# Patient Record
Sex: Male | Born: 1990 | Race: Black or African American | Hispanic: No | Marital: Single | Smoking: Never smoker
Health system: Southern US, Community
[De-identification: ages and names within clinical notes are randomized; demographics above are authoritative.]

## PROBLEM LIST (undated history)

## (undated) DIAGNOSIS — I10 Essential (primary) hypertension: Secondary | ICD-10-CM

---

## 2016-11-10 ENCOUNTER — Encounter (HOSPITAL_COMMUNITY): Payer: Self-pay | Admitting: Emergency Medicine

## 2016-11-10 ENCOUNTER — Emergency Department (HOSPITAL_COMMUNITY): Payer: 59

## 2016-11-10 ENCOUNTER — Emergency Department (HOSPITAL_COMMUNITY)
Admission: EM | Admit: 2016-11-10 | Discharge: 2016-11-10 | Disposition: A | Discharge status: Home / Self Care | Payer: 59 | Attending: Emergency Medicine | Admitting: Emergency Medicine

## 2016-11-10 DIAGNOSIS — Y9289 Other specified places as the place of occurrence of the external cause: Secondary | ICD-10-CM | POA: Diagnosis not present

## 2016-11-10 DIAGNOSIS — S62609A Fracture of unspecified phalanx of unspecified finger, initial encounter for closed fracture: Secondary | ICD-10-CM

## 2016-11-10 DIAGNOSIS — S6991XA Unspecified injury of right wrist, hand and finger(s), initial encounter: Secondary | ICD-10-CM | POA: Diagnosis present

## 2016-11-10 DIAGNOSIS — Y9367 Activity, basketball: Secondary | ICD-10-CM | POA: Insufficient documentation

## 2016-11-10 DIAGNOSIS — W230XXA Caught, crushed, jammed, or pinched between moving objects, initial encounter: Secondary | ICD-10-CM | POA: Diagnosis not present

## 2016-11-10 DIAGNOSIS — Y999 Unspecified external cause status: Secondary | ICD-10-CM | POA: Insufficient documentation

## 2016-11-10 DIAGNOSIS — S63286A Dislocation of proximal interphalangeal joint of right little finger, initial encounter: Secondary | ICD-10-CM

## 2016-11-10 DIAGNOSIS — S62626A Displaced fracture of medial phalanx of right little finger, initial encounter for closed fracture: Secondary | ICD-10-CM | POA: Diagnosis not present

## 2016-11-10 MED ORDER — HYDROMORPHONE HCL 1 MG/ML IJ SOLN
1.0000 mg | Freq: Once | INTRAMUSCULAR | Status: AC
  Administered 2016-11-10: 1 mg via INTRAMUSCULAR
  Filled 2016-11-10: qty 1

## 2016-11-10 MED ORDER — LIDOCAINE HCL 2 % IJ SOLN
5.0000 mL | Freq: Once | INTRAMUSCULAR | Status: AC
  Administered 2016-11-10: 100 mg
  Filled 2016-11-10: qty 20

## 2016-11-10 MED ORDER — IBUPROFEN 400 MG PO TABS
ORAL_TABLET | ORAL | Status: AC
  Filled 2016-11-10: qty 1

## 2016-11-10 MED ORDER — HYDROMORPHONE HCL 1 MG/ML IJ SOLN: 1.0000 mg | Freq: Once | INTRAMUSCULAR | Status: DC

## 2016-11-10 MED ORDER — IBUPROFEN 400 MG PO TABS
400.0000 mg | ORAL_TABLET | Freq: Once | ORAL | Status: AC
  Administered 2016-11-10: 400 mg via ORAL

## 2016-11-10 NOTE — ED Notes (Signed)
Pt verbalized understanding discharge instructions and denies any further needs or questions at this time. VS stable, ambulatory and steady gait.   

## 2016-11-10 NOTE — ED Provider Notes (Signed)
[facility]-EMERGENCY DEPT Provider Note    By signing my name below, I, Earmon Phoenix, attest that this documentation has been prepared under the direction and in the presence of Mia McDonald, PA-C. Electronically Signed: Earmon Phoenix, ED Scribe. 11/10/16. 11:12 PM    History   Chief Complaint Chief Complaint  Patient presents with  . Hand Pain    The history is provided by the patient and medical records. No language interpreter was used.    Seth Jones is a 26 y.o. male who presents to the Emergency Department complaining of an injury to the right fifth digit that occurred PTA while playing basketball. He reports associated pain and swelling. Pt states the finger jammed and he fell on the finger. He has not taken anything for pain. Touching or attempting to move the finger increases the pain. He denies alleviating factors. He denies numbness, tingling or weakness of the right hand or fingers, bruising, wounds. He denies any previous injury to the right hand or finger.    History reviewed. No pertinent past medical history.  There are no active problems to display for this patient.   History reviewed. No pertinent surgical history.     Home Medications    Prior to Admission medications   Not on File    Family History History reviewed. No pertinent family history.  Social History Social History  Substance Use Topics  . Smoking status: Never Smoker  . Smokeless tobacco: Never Used  . Alcohol use No     Allergies   Patient has no allergy information on record.   Review of Systems Review of Systems  Constitutional: Negative for activity change.  Respiratory: Negative for shortness of breath.   Cardiovascular: Negative for chest pain.  Gastrointestinal: Negative for abdominal pain.  Musculoskeletal: Positive for arthralgias and joint swelling. Negative for back pain.  Skin: Negative for color change, rash and wound.  Neurological: Negative for weakness  and numbness.     Physical Exam Updated Vital Signs BP (!) 131/91 (BP Location: Left Arm)   Pulse 82   Temp 98.9 F (37.2 C) (Oral)   Resp 18   SpO2 98%   Physical Exam  Constitutional: He appears well-developed.  HENT:  Head: Normocephalic.  Eyes: Conjunctivae are normal.  Neck: Neck supple.  Cardiovascular: Normal rate and regular rhythm.   No murmur heard. Radial pulses 2+ bilaterally.  Pulmonary/Chest: Effort normal.  Abdominal: Soft. He exhibits no distension.  Musculoskeletal:  Decreased ROM and strength against resistance of right pinky secondary to pain. Swelling of the PIP joint. No overlying ecchymosis or lacerations.  Neurological: He is alert.  Sensations intact.  Skin: Skin is warm and dry.  Psychiatric: His behavior is normal.  Nursing note and vitals reviewed.    ED Treatments / Results  DIAGNOSTIC STUDIES: Oxygen Saturation is 98% on RA, normal by my interpretation.   COORDINATION OF CARE: 8:41 PM- Will order imaging. Pt verbalizes understanding and agrees to plan.  Medications  ibuprofen (ADVIL,MOTRIN) tablet 400 mg (400 mg Oral Given 11/10/16 2020)  lidocaine (XYLOCAINE) 2 % (with pres) injection 100 mg (100 mg Infiltration Given 11/10/16 2217)  HYDROmorphone (DILAUDID) injection 1 mg (1 mg Intramuscular Given 11/10/16 2217)   Labs (all labs ordered are listed, but only abnormal results are displayed) Labs Reviewed - No data to display  EKG  EKG Interpretation None       Radiology Dg Hand Complete Right  Result Date: 11/10/2016 CLINICAL DATA:  basketball injruy with "popping" sound  and decreased motion; Right 5th digit has obvious deformity EXAM: RIGHT HAND - COMPLETE 3+ VIEW COMPARISON:  None. FINDINGS: The PIP joint of the fifth finger is dislocated. The middle phalanx has dislocated posteriorly in relation to the proximal phalanx. No fracture. Remaining joints are normally spaced and aligned. IMPRESSION: Dislocated PIP joint of the right  fifth finger.  No fracture. Electronically Signed   By: Amie Portland M.D.   On: 11/10/2016 22:02   Dg Finger Little Right  Result Date: 11/10/2016 CLINICAL DATA:  Fifth PIP dislocation EXAM: RIGHT LITTLE FINGER 2+V COMPARISON:  11/10/2016 at 21:32 FINDINGS: Three images obtained through splint confirm successful reduction of the PIP dislocation. There is a small fracture fragment at the dorsal base of the fifth middle phalanx. Bone detail is limited due to superimposed splint. IMPRESSION: Successful reduction of the PIP dislocation. At least 1 small fracture fragment is visible dorsally at the PIP. Electronically Signed   By: Ellery Plunk M.D.   On: 11/10/2016 23:09    Procedures Reduction of dislocation Date/Time: 11/10/2016 10:26 PM Performed by: Lilian Kapur, MIA A Authorized by: Frederik Pear A  Consent: Verbal consent obtained. Risks and benefits: risks, benefits and alternatives were discussed Consent given by: patient Patient understanding: patient states understanding of the procedure being performed Patient consent: the patient's understanding of the procedure matches consent given Relevant documents: relevant documents present and verified Test results: test results available and properly labeled Site marked: the operative site was marked Imaging studies: imaging studies available Required items: required blood products, implants, devices, and special equipment available Patient identity confirmed: verbally with patient Preparation: Patient was prepped and draped in the usual sterile fashion. Local anesthesia used: yes Anesthesia: digital block  Anesthesia: Local anesthesia used: yes Local Anesthetic: lidocaine 2% without epinephrine Anesthetic total: 4 mL  Sedation: Patient sedated: no Patient tolerance: Patient tolerated the procedure well with no immediate complications    (including critical care time)  Medications Ordered in ED Medications  ibuprofen  (ADVIL,MOTRIN) tablet 400 mg (400 mg Oral Given 11/10/16 2020)  lidocaine (XYLOCAINE) 2 % (with pres) injection 100 mg (100 mg Infiltration Given 11/10/16 2217)  HYDROmorphone (DILAUDID) injection 1 mg (1 mg Intramuscular Given 11/10/16 2217)     Initial Impression / Assessment and Plan / ED Course  I have reviewed the triage vital signs and the nursing notes.  Pertinent labs & imaging results that were available during my care of the patient were reviewed by me and considered in my medical decision making (see chart for details).     Patient presenting with dislocated PIP joint of the right pinky following an injury earlier tonight as noted on x-ray. Digital block and reduction performed in the ED. The patient tolerated the procedure well and was placed in a finger splint. Post procedure x-rays demonstrated successful reduction, but noted 1 small fracture fragment at the dorsal aspect of the PIP, which was likely obscured by the dislocation on preprocedure imaging. No fragments noted on my read of the x-ray. Given questionable fragment, we will encourage the patient to remain splinted for 1 week and follow up with ortho outpatient. Return precautions given. No acute distress. Vital signs stable. The patient is safe for discharge at this time.  Final Clinical Impressions(s) / ED Diagnoses   Final diagnoses:  Dislocation of proximal interphalangeal joint of right little finger, initial encounter  Closed fracture dislocation of proximal interphalangeal (PIP) joint of finger, initial encounter    New Prescriptions There are no discharge  medications for this patient.   I personally performed the services described in this documentation, which was scribed in my presence. The recorded information has been reviewed and is accurate.     Frederik PearMcDonald, Mia A, PA-C 11/11/16 [id number]    Doug SouJacubowitz, Sam, MD 11/11/16 [phone]

## 2016-11-10 NOTE — Progress Notes (Signed)
Orthopedic Tech Progress Note Patient Details:  Seth Jones Jan 07, 1991 [id number]  Ortho Devices Type of Ortho Device: Finger splint Ortho Device/Splint Location: RUE Ortho Device/Splint Interventions: Ordered, Application   Jennye MoccasinHughes, Elmo Shumard [name] 11/10/2016, 10:48 PM

## 2016-11-10 NOTE — ED Triage Notes (Signed)
Pt presents to ED for right hand/pinky pain after catching a ball oddly during a basketball game.  Pt states he heard a loud "pop" during the injury.  Sensation intact, movement limited.

## 2016-11-10 NOTE — Discharge Instructions (Signed)
Please wear the finger splint for the next week when you are reevaluated by Dr. Merlyn LotKuzma. Please call Dr. Merrilee SeashoreKuzma's office to schedule a follow-up appointment in 1 week. If the finger becomes stiff, or if you have other complications I will provided with a referral to orthopedics. You can continue to apply ice to the finger and take 800 mg of Motrin every 8 hours with food as needed to help with inflammation and swelling. If you develop numbness, weakness, or if the finger becomes cold, please return to the emergency department for reevaluation.

## 2018-04-30 IMAGING — DX DG HAND COMPLETE 3+V*R*
3 series · 3 of 3 positions shown · non-contrast
Comparison: None.

CLINICAL DATA: basketball injruy with "popping" sound and decreased
motion; Right 5th digit has obvious deformity

EXAM:
RIGHT HAND - COMPLETE 3+ VIEW

[hand pa]
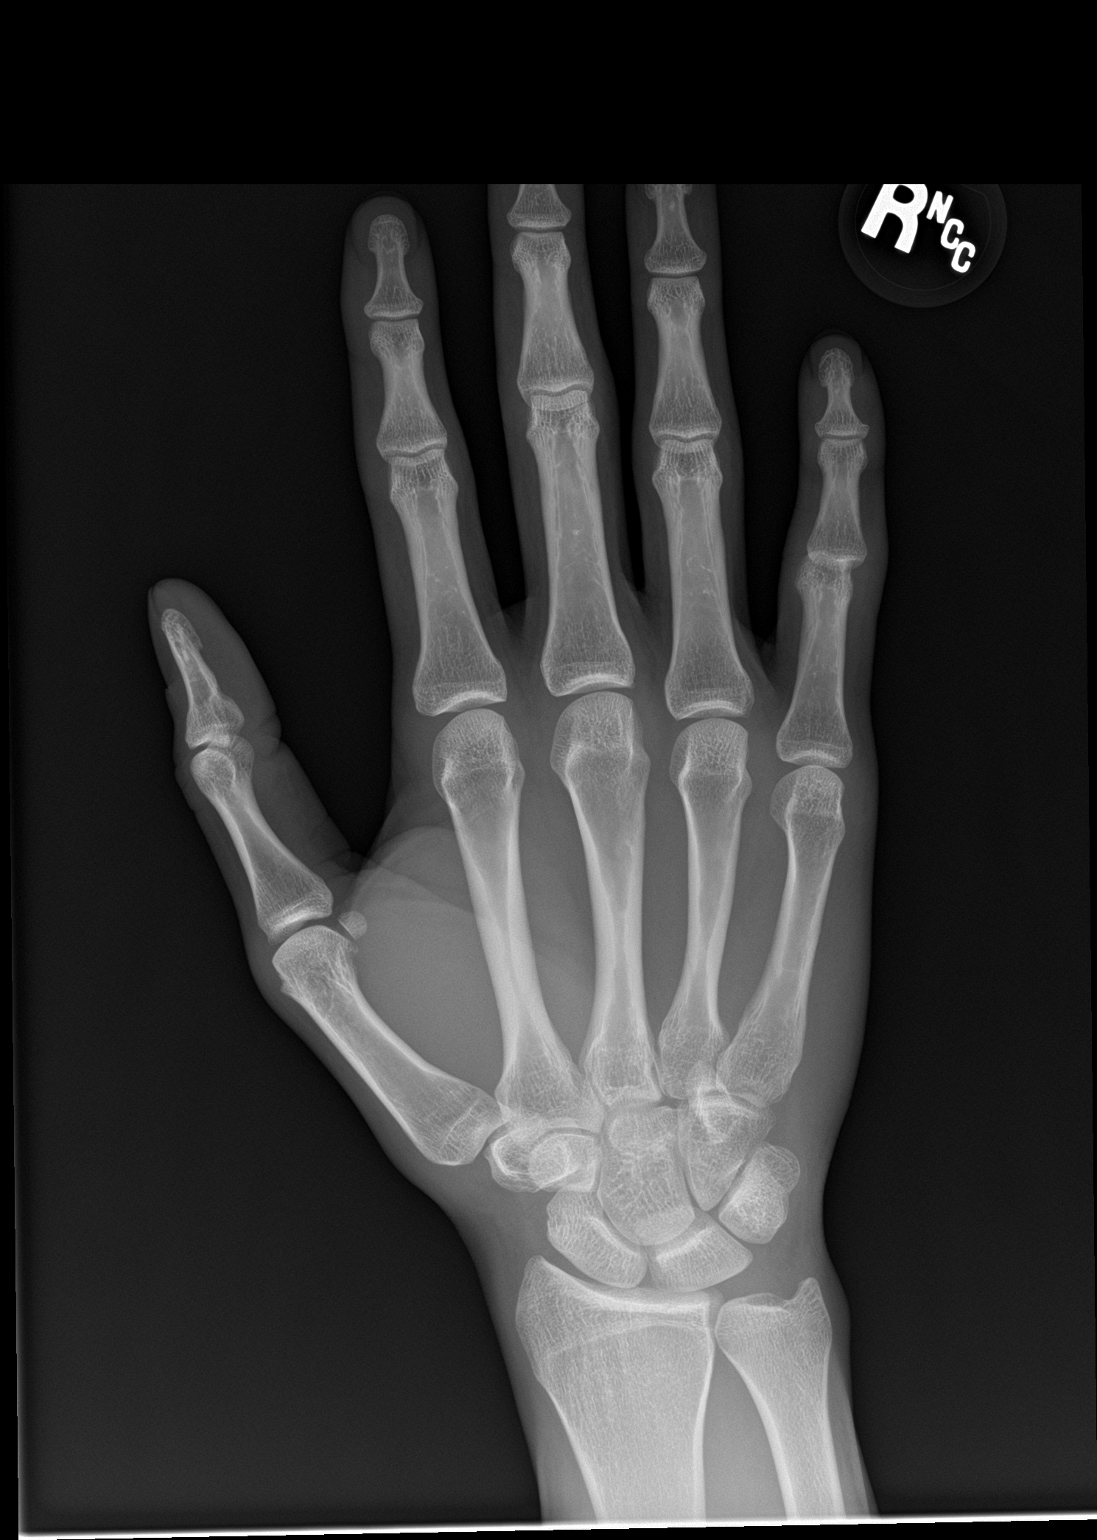

[hand obl]
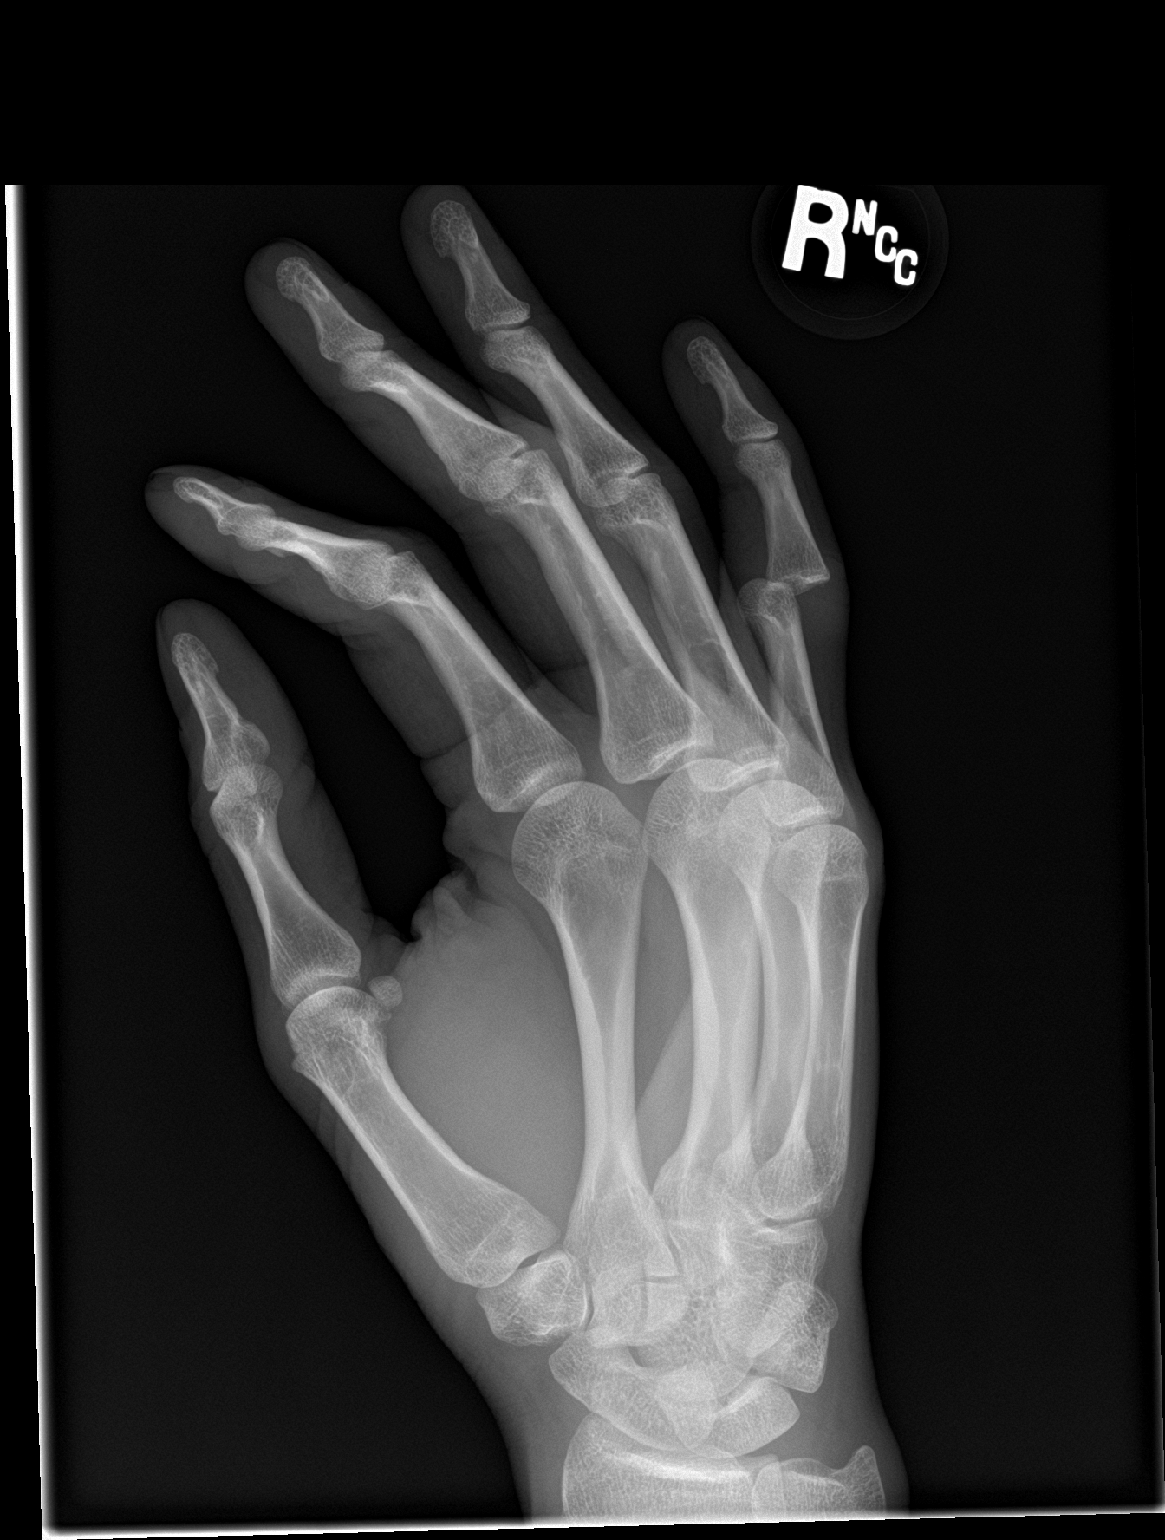

[hand lat]
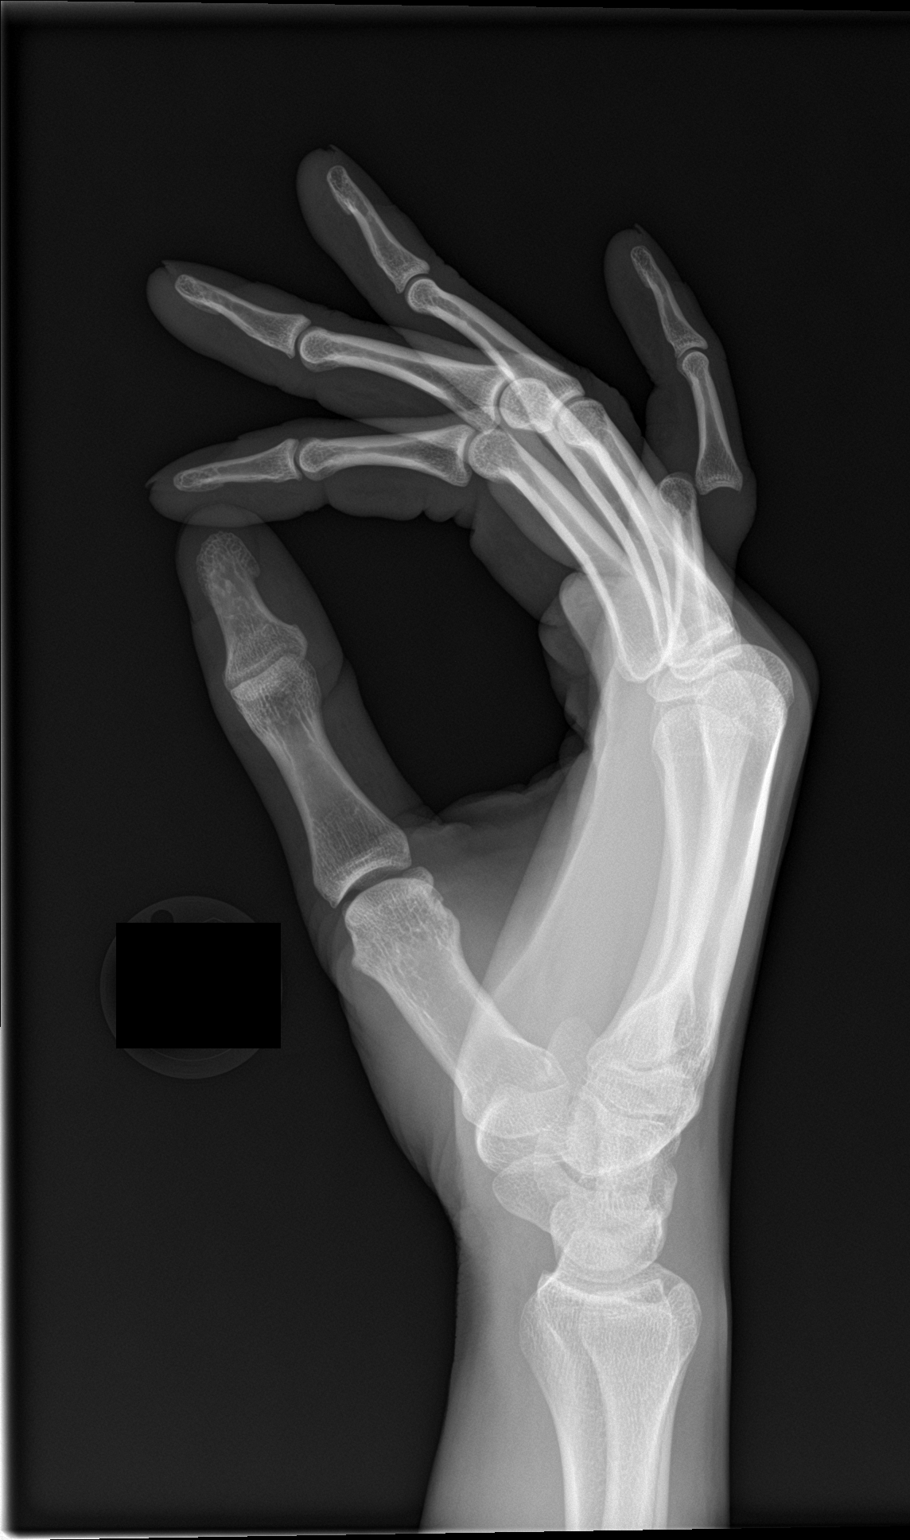

[3 of 3 positions shown; findings below may reference images not displayed]

FINDINGS: The PIP joint of the fifth finger is dislocated. The middle phalanx
has dislocated posteriorly in relation to the proximal phalanx. No
fracture. Remaining joints are normally spaced and aligned.
IMPRESSION: Dislocated PIP joint of the right fifth finger.  No fracture.

## 2019-06-15 ENCOUNTER — Ambulatory Visit: Payer: 59 | Attending: Internal Medicine

## 2019-06-15 DIAGNOSIS — Z20822 Contact with and (suspected) exposure to covid-19: Secondary | ICD-10-CM

## 2019-06-16 LAB — NOVEL CORONAVIRUS, NAA: SARS-CoV-2, NAA: NOT DETECTED

## 2021-07-22 ENCOUNTER — Other Ambulatory Visit: Payer: Self-pay

## 2021-07-22 ENCOUNTER — Encounter (HOSPITAL_COMMUNITY): Payer: Self-pay | Admitting: Emergency Medicine

## 2021-07-22 ENCOUNTER — Emergency Department (HOSPITAL_COMMUNITY)
Admission: EM | Admit: 2021-07-22 | Discharge: 2021-07-22 | Disposition: A | Discharge status: Home / Self Care | Attending: Emergency Medicine | Admitting: Emergency Medicine

## 2021-07-22 ENCOUNTER — Emergency Department (HOSPITAL_COMMUNITY)

## 2021-07-22 DIAGNOSIS — S99912A Unspecified injury of left ankle, initial encounter: Secondary | ICD-10-CM | POA: Diagnosis present

## 2021-07-22 DIAGNOSIS — Y9367 Activity, basketball: Secondary | ICD-10-CM | POA: Diagnosis not present

## 2021-07-22 DIAGNOSIS — S93492A Sprain of other ligament of left ankle, initial encounter: Secondary | ICD-10-CM | POA: Diagnosis not present

## 2021-07-22 DIAGNOSIS — M25569 Pain in unspecified knee: Secondary | ICD-10-CM | POA: Insufficient documentation

## 2021-07-22 DIAGNOSIS — R200 Anesthesia of skin: Secondary | ICD-10-CM | POA: Insufficient documentation

## 2021-07-22 DIAGNOSIS — X501XXA Overexertion from prolonged static or awkward postures, initial encounter: Secondary | ICD-10-CM | POA: Insufficient documentation

## 2021-07-22 NOTE — ED Provider Notes (Signed)
?[facility] EMERGENCY DEPARTMENT ?Provider Note ? ? ?CSN: [id number] ?Arrival date & time: 07/22/21  1920 ? ?  ? ?History ? ?Chief Complaint  ?Patient presents with  ? Ankle Pain  ? ? ?Seth Jones is a 31 y.o. male. ? ?31 yo M with a chief complaint of left ankle pain.  The patient was playing basketball about 48 hours ago and he jumped to get a rebound and landed on another player's foot and inverted his ankle.  Complaining of pain and swelling to the ankle.  Has been able to ambulate but with pain.  He has a mild ache to the knee but denies any other injury. ? ? ?Ankle Pain ? ?  ? ?Home Medications ?Prior to Admission medications   ?Not on File  ?   ? ?Allergies    ?Patient has no known allergies.   ? ?Review of Systems   ?Review of Systems ? ?Physical Exam ?Updated Vital Signs ?BP (!) 153/77   Pulse 69   Temp 98.1 ?F (36.7 ?C) (Oral)   Resp 16   Ht 6\' 2"  (1.88 m)   Wt 97.5 kg   SpO2 96%   BMI 27.60 kg/m?  ?Physical Exam ?Vitals and nursing note reviewed.  ?Constitutional:   ?   Appearance: He is well-developed.  ?HENT:  ?   Head: Normocephalic and atraumatic.  ?Eyes:  ?   Pupils: Pupils are equal, round, and reactive to light.  ?Neck:  ?   Vascular: No JVD.  ?Cardiovascular:  ?   Rate and Rhythm: Normal rate and regular rhythm.  ?   Heart sounds: No murmur heard. ?  No friction rub. No gallop.  ?Pulmonary:  ?   Effort: No respiratory distress.  ?   Breath sounds: No wheezing.  ?Abdominal:  ?   General: There is no distension.  ?   Tenderness: There is no abdominal tenderness. There is no guarding or rebound.  ?Musculoskeletal:     ?   General: Swelling present. Normal range of motion.  ?   Cervical back: Normal range of motion and neck supple.  ?   Comments: Pain and swelling to the left ankle diffusely worse on the lateral aspect.  Pain along the anterior talofibular ligament.  Pulse motor and sensation intact distally.  He describes a subjective numbness along the left second digit on  the dorsal aspect.  ?Skin: ?   Coloration: Skin is not pale.  ?   Findings: No rash.  ?Neurological:  ?   Mental Status: He is alert and oriented to person, place, and time.  ?Psychiatric:     ?   Behavior: Behavior normal.  ? ? ?ED Results / Procedures / Treatments   ?Labs ?(all labs ordered are listed, but only abnormal results are displayed) ?Labs Reviewed - No data to display ? ?EKG ?None ? ?Radiology ?DG Ankle Complete Left ? ?Result Date: 07/22/2021 ?CLINICAL DATA:  Swelling EXAM: LEFT ANKLE COMPLETE - 3+ VIEW COMPARISON:  None. FINDINGS: There is no evidence of fracture, dislocation, or joint effusion. There is no evidence of arthropathy or other focal bone abnormality. Diffuse soft tissue edema about the anterior and lateral ankle. IMPRESSION: 1. No fracture or dislocation of the left ankle. Joint spaces are well preserved. 2.  Diffuse soft tissue edema about the anterior and lateral ankle. Electronically Signed   By: 09/21/2021 M.D.   On: 07/22/2021 20:01   ? ?Procedures ?Procedures  ? ? ?Medications Ordered in ED ?  Medications - No data to display ? ?ED Course/ Medical Decision Making/ A&P ?  ?                        ?Medical Decision Making ?Amount and/or Complexity of Data Reviewed ?Radiology: ordered. ? ? ?31 yo M with a chief complaints of pain to the left ankle.  The patient was playing basketball couple days ago and had an eversion injury when he landed on another player's foot.  Plain film independently interpreted by me without fracture or dislocation.  Will place in an ASO crutches.  PCP follow-up. ? ?Patient does not have a PCP that can get him in rapidly we will give sports medicine follow-up as well. ? ?8:39 PM:  I have discussed the diagnosis/risks/treatment options with the patient.  Evaluation and diagnostic testing in the emergency department does not suggest an emergent condition requiring admission or immediate intervention beyond what has been performed at this time.  They will follow up  with  PCP. We also discussed returning to the ED immediately if new or worsening sx occur. We discussed the sx which are most concerning (e.g., sudden worsening pain, fever, inability to tolerate by mouth) that necessitate immediate return. Medications administered to the patient during their visit and any new prescriptions provided to the patient are listed below. ? ?Medications given during this visit ?Medications - No data to display ? ? ?The patient appears reasonably screen and/or stabilized for discharge and I doubt any other medical condition or other [facility] requiring further screening, evaluation, or treatment in the ED at this time prior to discharge.  ? ? ? ? ? ? ? ? ?Final Clinical Impression(s) / ED Diagnoses ?Final diagnoses:  ?Sprain of anterior talofibular ligament of left ankle, initial encounter  ? ? ?Rx / DC Orders ?ED Discharge Orders   ? ? None  ? ?  ? ? ?  ?Melene Plan, DO ?07/22/21 2039 ? ?

## 2021-07-22 NOTE — Progress Notes (Signed)
Orthopedic Tech Progress Note ?Patient Details:  ?Seth Jones ?04-07-1991 ?[id number] ? ?Ortho Devices ?Type of Ortho Device: Crutches, ASO ?Ortho Device/Splint Location: lle ?Ortho Device/Splint Interventions: Ordered, Adjustment, Application ?  ?Post Interventions ?Patient Tolerated: Well ?Instructions Provided: Care of device, Adjustment of device ? ?Trinna Post ?07/22/2021, 9:14 PM ? ?

## 2021-07-22 NOTE — ED Triage Notes (Signed)
Pt reports he came down on his left ankle "wrong."  Despite taking OTC medications and icing it, pain continues to get worse and there is swelling. ?

## 2021-07-22 NOTE — Discharge Instructions (Signed)
Take 4 over the counter ibuprofen tablets 3 times a day or 2 over-the-counter naproxen tablets twice a day for pain. Also take tylenol 1000mg(2 extra strength) four times a day.    

## 2021-08-23 ENCOUNTER — Encounter (HOSPITAL_COMMUNITY): Payer: Self-pay | Admitting: Emergency Medicine

## 2021-08-23 ENCOUNTER — Other Ambulatory Visit: Payer: Self-pay

## 2021-08-23 ENCOUNTER — Emergency Department (HOSPITAL_COMMUNITY)
Admission: EM | Admit: 2021-08-23 | Discharge: 2021-08-23 | Disposition: A | Discharge status: Home / Self Care | Attending: Emergency Medicine | Admitting: Emergency Medicine

## 2021-08-23 DIAGNOSIS — Y9301 Activity, walking, marching and hiking: Secondary | ICD-10-CM | POA: Insufficient documentation

## 2021-08-23 DIAGNOSIS — X500XXA Overexertion from strenuous movement or load, initial encounter: Secondary | ICD-10-CM | POA: Insufficient documentation

## 2021-08-23 DIAGNOSIS — S39012A Strain of muscle, fascia and tendon of lower back, initial encounter: Secondary | ICD-10-CM | POA: Insufficient documentation

## 2021-08-23 DIAGNOSIS — S3992XA Unspecified injury of lower back, initial encounter: Secondary | ICD-10-CM | POA: Diagnosis present

## 2021-08-23 DIAGNOSIS — M25552 Pain in left hip: Secondary | ICD-10-CM | POA: Diagnosis not present

## 2021-08-23 HISTORY — DX: Essential (primary) hypertension: I10

## 2021-08-23 MED ORDER — IBUPROFEN 600 MG PO TABS: 600.0000 mg | ORAL_TABLET | Freq: Four times a day (QID) | ORAL | 0 refills | Status: DC | PRN

## 2021-08-23 MED ORDER — CYCLOBENZAPRINE HCL 10 MG PO TABS: 10.0000 mg | ORAL_TABLET | Freq: Two times a day (BID) | ORAL | 0 refills | Status: AC | PRN

## 2021-08-23 MED ORDER — DICLOFENAC SODIUM 1 % EX GEL: 2.0000 g | Freq: Four times a day (QID) | CUTANEOUS | 0 refills | Status: AC

## 2021-08-23 NOTE — Discharge Instructions (Addendum)
Take ibuprofen and/or flexeril as needed for pain.  You may apply voltaren gel as needed to affected area for symptom control.  Follow up with orthopedist if your pain not relief.   ?

## 2021-08-23 NOTE — ED Triage Notes (Addendum)
Pt c/o left hip pain that shoots down his leg. Pt ambulatory to triage.  ?

## 2021-08-23 NOTE — ED Provider Notes (Signed)
?[facility] EMERGENCY DEPARTMENT ?Provider Note ? ? ?CSN: [id number] ?Arrival date & time: 08/23/21  0539 ? ?  ? ?History ? ?Chief Complaint  ?Patient presents with  ? Hip Pain  ? ? ?Eziah Gravlin is a 31 y.o. male. ? ?The history is provided by the patient and medical records. No language interpreter was used.  ?Hip Pain ? ? ? 31 year old male presenting c/o leg pain.  Pt report he sprained his ankle a month ago and have been trying to rehabilitate it.  For the past 3 days he report having progressive worsening pain to L hip radiates down his leg.  Pain is localize to the lateral aspect of his hip, increase with lifting of his leg and with walking.  Pain shoots down to leg.  No fever, bowel/bladder incontinence or saddle anesthesia. No injury, no numbness.  He has missed work due to the pain.  He tries heat/ice and stretching without relief.  ? ?Home Medications ?Prior to Admission medications   ?Not on File  ?   ? ?Allergies    ?Patient has no known allergies.   ? ?Review of Systems   ?Review of Systems  ?All other systems reviewed and are negative. ? ?Physical Exam ?Updated Vital Signs ?BP (!) 149/107 (BP Location: Right Arm)   Pulse 61   Temp 97.9 ?F (36.6 ?C) (Oral)   Resp 18   Ht 6\' 2"  (1.88 m)   Wt 93 kg   SpO2 97%   BMI 26.32 kg/m?  ?Physical Exam ?Vitals and nursing note reviewed.  ?Constitutional:   ?   General: He is not in acute distress. ?   Appearance: He is well-developed.  ?HENT:  ?   Head: Atraumatic.  ?Eyes:  ?   Conjunctiva/sclera: Conjunctivae normal.  ?Musculoskeletal:     ?   General: Tenderness (L leg: tenderness to Left lumbosacral region, increased with hip flexion and leg raise.  intact DP pulse, no foot drops.) present.  ?   Cervical back: Neck supple.  ?   Comments: No midline spine tenderness.   ?Skin: ?   Findings: No rash.  ?Neurological:  ?   Mental Status: He is alert.  ? ? ?ED Results / Procedures / Treatments   ?Labs ?(all labs ordered are listed, but only  abnormal results are displayed) ?Labs Reviewed - No data to display ? ?EKG ?None ? ?Radiology ?No results found. ? ?Procedures ?Procedures  ? ? ?Medications Ordered in ED ?Medications - No data to display ? ?ED Course/ Medical Decision Making/ A&P ?  ?                        ?Medical Decision Making ? ?BP (!) 149/107 (BP Location: Right Arm)   Pulse 61   Temp 97.9 ?F (36.6 ?C) (Oral)   Resp 18   Ht 6\' 2"  (1.88 m)   Wt 93 kg   SpO2 97%   BMI 26.32 kg/m?  ? ?5:57 AM ?Pt here with c/o pain to L hip radiates down his leg x 3 days.  Reproducible pain to Left lumbosacral region.  No red flags.  Suspect lumbosacral strain vs sciatica.  Doubt osteomyelitis, discitis, caudal equina, cellulitis, septic joint.  Will provide sxs treatment.  Pt able to ambulate.  I have considered xray of L hip but without injury, I felt it will be low yield.  ? ? ? ? ? ? ? ?Final Clinical Impression(s) / ED  Diagnoses ?Final diagnoses:  ?Lumbosacral strain, initial encounter  ? ? ?Rx / DC Orders ?ED Discharge Orders   ? ?      Ordered  ?  ibuprofen (ADVIL) 600 MG tablet  Every 6 hours PRN       ? 08/23/21 0601  ?  cyclobenzaprine (FLEXERIL) 10 MG tablet  2 times daily PRN       ? 08/23/21 0601  ?  diclofenac Sodium (VOLTAREN) 1 % GEL  4 times daily       ? 08/23/21 0601  ? ?  ?  ? ?  ? ? ?  ?Fayrene Helper, PA-C ?08/23/21 0603 ? ?  ?Marily Memos, MD ?08/23/21 2992 ? ?

## 2022-05-07 ENCOUNTER — Emergency Department (HOSPITAL_COMMUNITY)
Admission: EM | Admit: 2022-05-07 | Discharge: 2022-05-08 | Disposition: A | Discharge status: Home / Self Care | Payer: No Typology Code available for payment source | Attending: Emergency Medicine | Admitting: Emergency Medicine

## 2022-05-07 ENCOUNTER — Other Ambulatory Visit: Payer: Self-pay

## 2022-05-07 ENCOUNTER — Emergency Department (HOSPITAL_COMMUNITY): Payer: No Typology Code available for payment source

## 2022-05-07 ENCOUNTER — Encounter (HOSPITAL_COMMUNITY): Payer: Self-pay

## 2022-05-07 DIAGNOSIS — M79661 Pain in right lower leg: Secondary | ICD-10-CM | POA: Diagnosis present

## 2022-05-07 DIAGNOSIS — Y9367 Activity, basketball: Secondary | ICD-10-CM | POA: Diagnosis not present

## 2022-05-07 DIAGNOSIS — X501XXA Overexertion from prolonged static or awkward postures, initial encounter: Secondary | ICD-10-CM | POA: Diagnosis not present

## 2022-05-07 MED ORDER — IBUPROFEN 600 MG PO TABS: 600.0000 mg | ORAL_TABLET | Freq: Four times a day (QID) | ORAL | 0 refills | Status: AC | PRN

## 2022-05-07 MED ORDER — IBUPROFEN 200 MG PO TABS
600.0000 mg | ORAL_TABLET | Freq: Once | ORAL | Status: AC
  Administered 2022-05-07: 600 mg via ORAL
  Filled 2022-05-07: qty 3

## 2022-05-07 NOTE — ED Provider Triage Note (Addendum)
Emergency Medicine Provider Triage Evaluation Note  Seth Jones , a 31 y.o. male  was evaluated in triage.  Pt complains of right calf/lower leg pain.  Patient states that he was playing basketball earlier today when he lunged forward reaching for a ball and heard a pop in his right calf region.  He subsequently has noticed decreased range of motion of right ankle.  Presents emergency department for evaluation..  Review of Systems  Positive: See above Negative:   Physical Exam  BP (!) 173/106 (BP Location: Left Arm)   Pulse 67   Temp 98.1 F (36.7 C)   Resp 18   Ht 6\' 2"  (1.88 m)   Wt 97.5 kg   SpO2 95%   BMI 27.60 kg/m  Gen:   Awake, no distress   Resp:  Normal effort  MSK:   Moves extremities without difficulty  Other:  Patient able to plantarflex some.  Tender to palpation right ankle.  Tender palpation right calf.  Medical Decision Making  Medically screening exam initiated at 9:32 PM.  Appropriate orders placed.  Seth Jones was informed that the remainder of the evaluation will be completed by another provider, this initial triage assessment does not replace that evaluation, and the importance of remaining in the ED until their evaluation is complete.  Possible right Achilles tendon tear   Seth Clos, PA 05/07/22 2133    2134, PA 05/07/22 2133    2134, Seth Jones 05/07/22 2134

## 2022-05-07 NOTE — Discharge Instructions (Signed)
I'm concerned about a calf muscle tear or achilles tender tear.  Please follow-up with the orthopedic.  You need to remain non-weightbearing.

## 2022-05-07 NOTE — ED Triage Notes (Signed)
Pt was playing basketball and heard a pop and has some pain to his right lower leg that goes down to his right ankle/foot.

## 2022-05-07 NOTE — ED Provider Notes (Signed)
[facility]ENCY DEPT [facility] Emergency Department Provider Note MRN:  [id number]  Arrival date & time: 05/07/22     Chief Complaint   Leg Injury   History of Present Illness   Seth Jones is a 31 y.o. year-old male presents to the ED with chief complaint of right calf pain.  States that he was playing basketball and lunged forward to get the ball and felt a pop in his calf.  He reports pain with palpation and hasn't been able to walk normally.  He denies any other injuries.  History provided by patient.   Review of Systems  Pertinent positive and negative review of systems noted in HPI.    Physical Exam   Vitals:   05/07/22 2104  BP: (!) 173/106  Pulse: 67  Resp: 18  Temp: 98.1 F (36.7 C)  SpO2: 95%    CONSTITUTIONAL:  well-appearing, NAD NEURO:  Alert and oriented x 3, CN 3-12 grossly intact EYES:  eyes equal and reactive ENT/NECK:  Supple, no stridor  CARDIO:  appears well-perfused  PULM:  No respiratory distress,  GI/GU:  non-distended,  MSK/SPINE:  Positive Thompson test concerning for achilles tendon disruption, TTP of the right gastroc SKIN:  no rash, atraumatic   *Additional and/or pertinent findings included in MDM below  Diagnostic and Interventional Summary    EKG Interpretation  Date/Time:    Ventricular Rate:    PR Interval:    QRS Duration:   QT Interval:    QTC Calculation:   R Axis:     Text Interpretation:         Labs Reviewed - No data to display  DG Ankle Complete Right  Final Result    DG Tibia/Fibula Right  Final Result      Medications  ibuprofen (ADVIL) tablet 600 mg (600 mg Oral Given 05/07/22 2158)     Procedures  /  Critical Care Procedures  ED Course and Medical Decision Making  I have reviewed the triage vital signs, the nursing notes, and pertinent available records from the EMR.  Social Determinants Affecting Complexity of Care: Patient has no clinically significant social determinants affecting  this chief complaint..   ED Course:    Medical Decision Making Patient here with right calf pain.  Hx and PE concerning for achilles or gastroc tear.  Patient given boot and crutches and instructed to remain NWB.  Advised to follow-up with ortho.    Amount and/or Complexity of Data Reviewed Radiology: independent interpretation performed.    Details: No fracture seen     Consultants: No consultations were needed in caring for this patient.   Treatment and Plan: Emergency department workup does not suggest an emergent condition requiring admission or immediate intervention beyond  what has been performed at this time. The patient is safe for discharge and has  been instructed to return immediately for worsening symptoms, change in  symptoms or any other concerns    Final Clinical Impressions(s) / ED Diagnoses     ICD-10-CM   1. Right calf pain  M79.661       ED Discharge Orders          Ordered    ibuprofen (ADVIL) 600 MG tablet  Every 6 hours PRN        05/07/22 2301              Discharge Instructions Discussed with and Provided to Patient:     Discharge Instructions      I'm concerned about  a calf muscle tear or achilles tender tear.  Please follow-up with the orthopedic.  You need to remain non-weightbearing.       Roxy Horseman, PA-C 05/07/22 3299    Bethann Berkshire, MD 05/16/22 1446

## 2023-01-09 IMAGING — CR DG ANKLE COMPLETE 3+V*L*
3 series · 3 of 3 positions shown · non-contrast
Comparison: None.

CLINICAL DATA: Swelling

EXAM:
LEFT ANKLE COMPLETE - 3+ VIEW

[ankle ap]
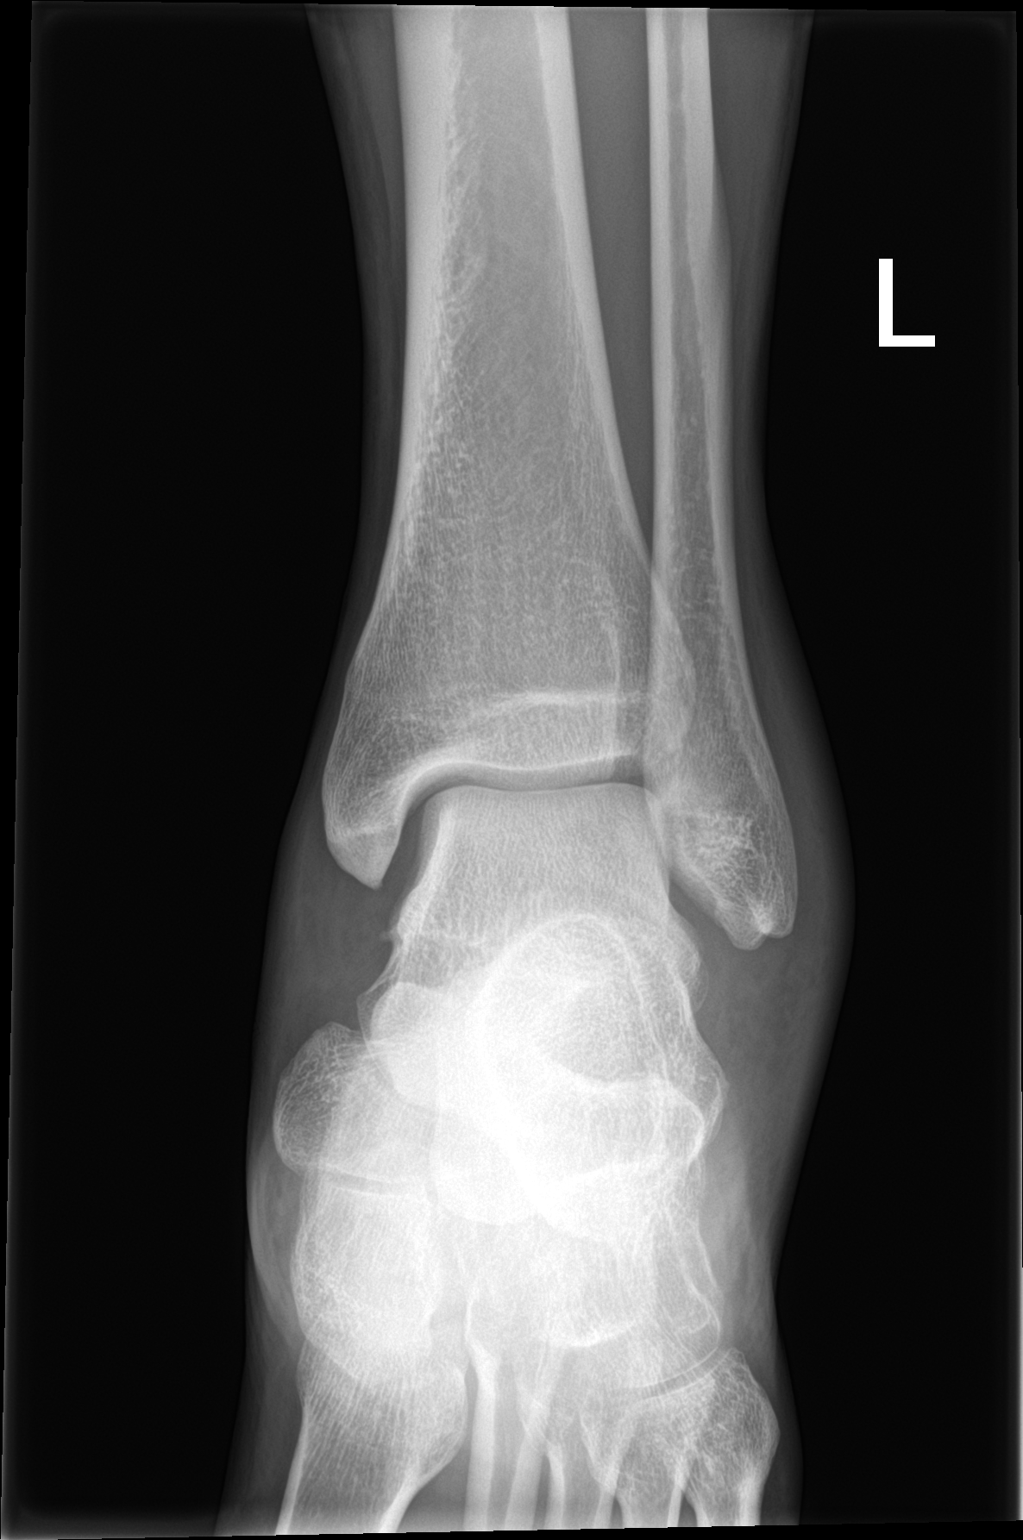

[ankle obl]
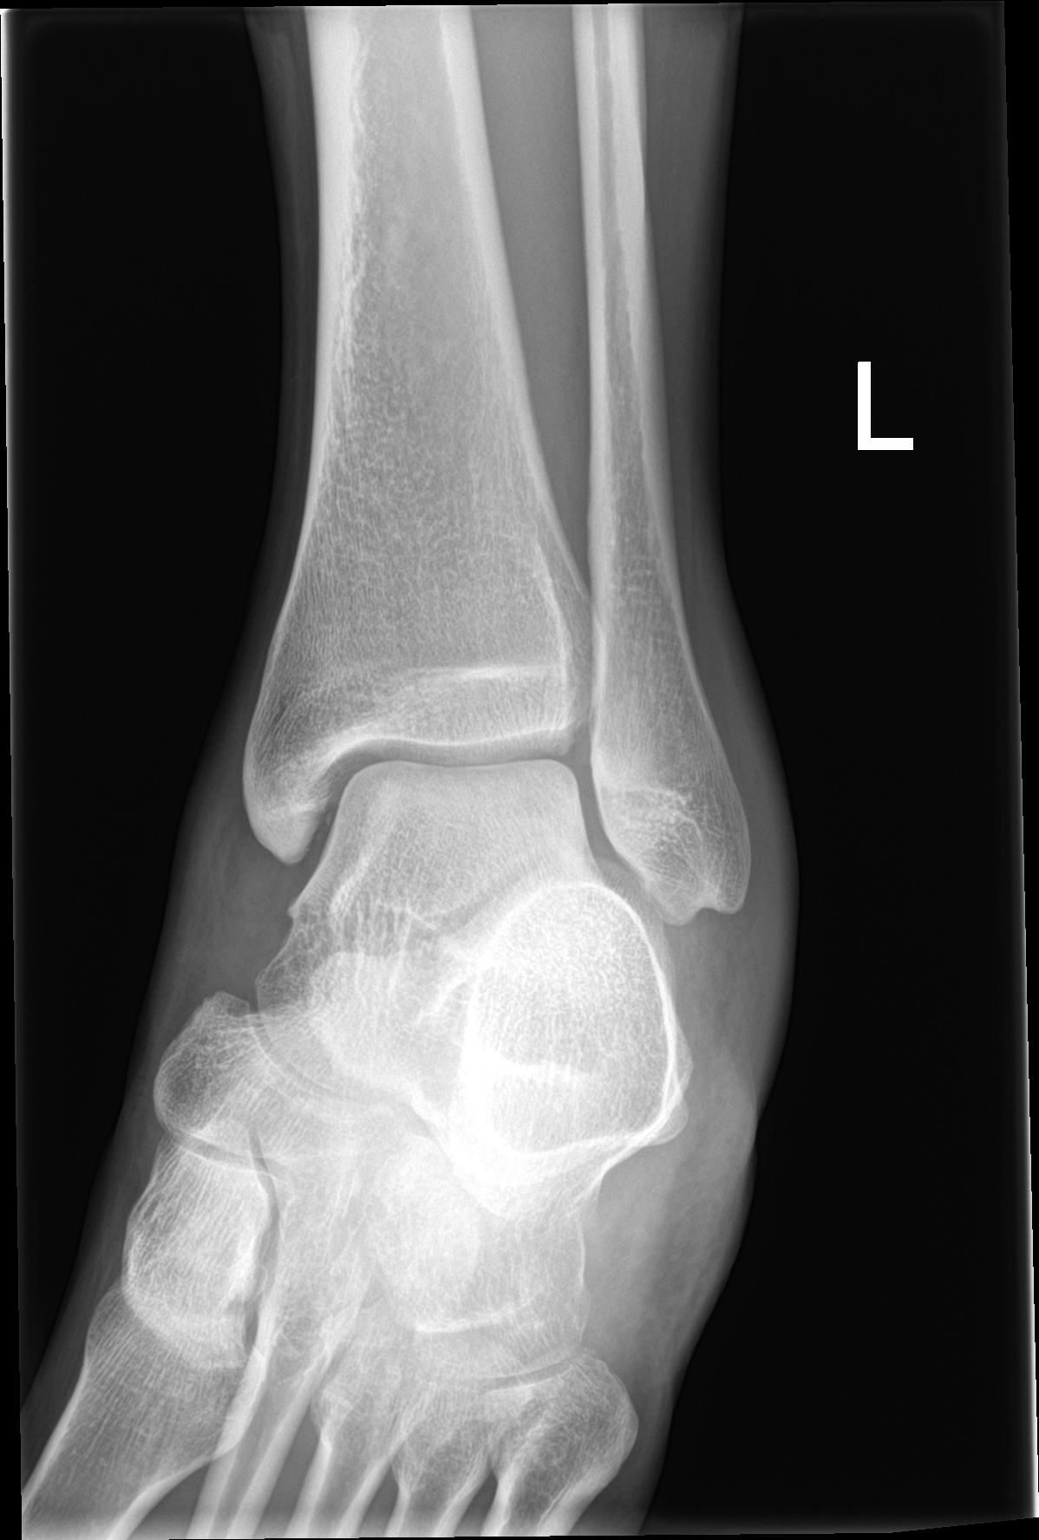

[ankle lat]
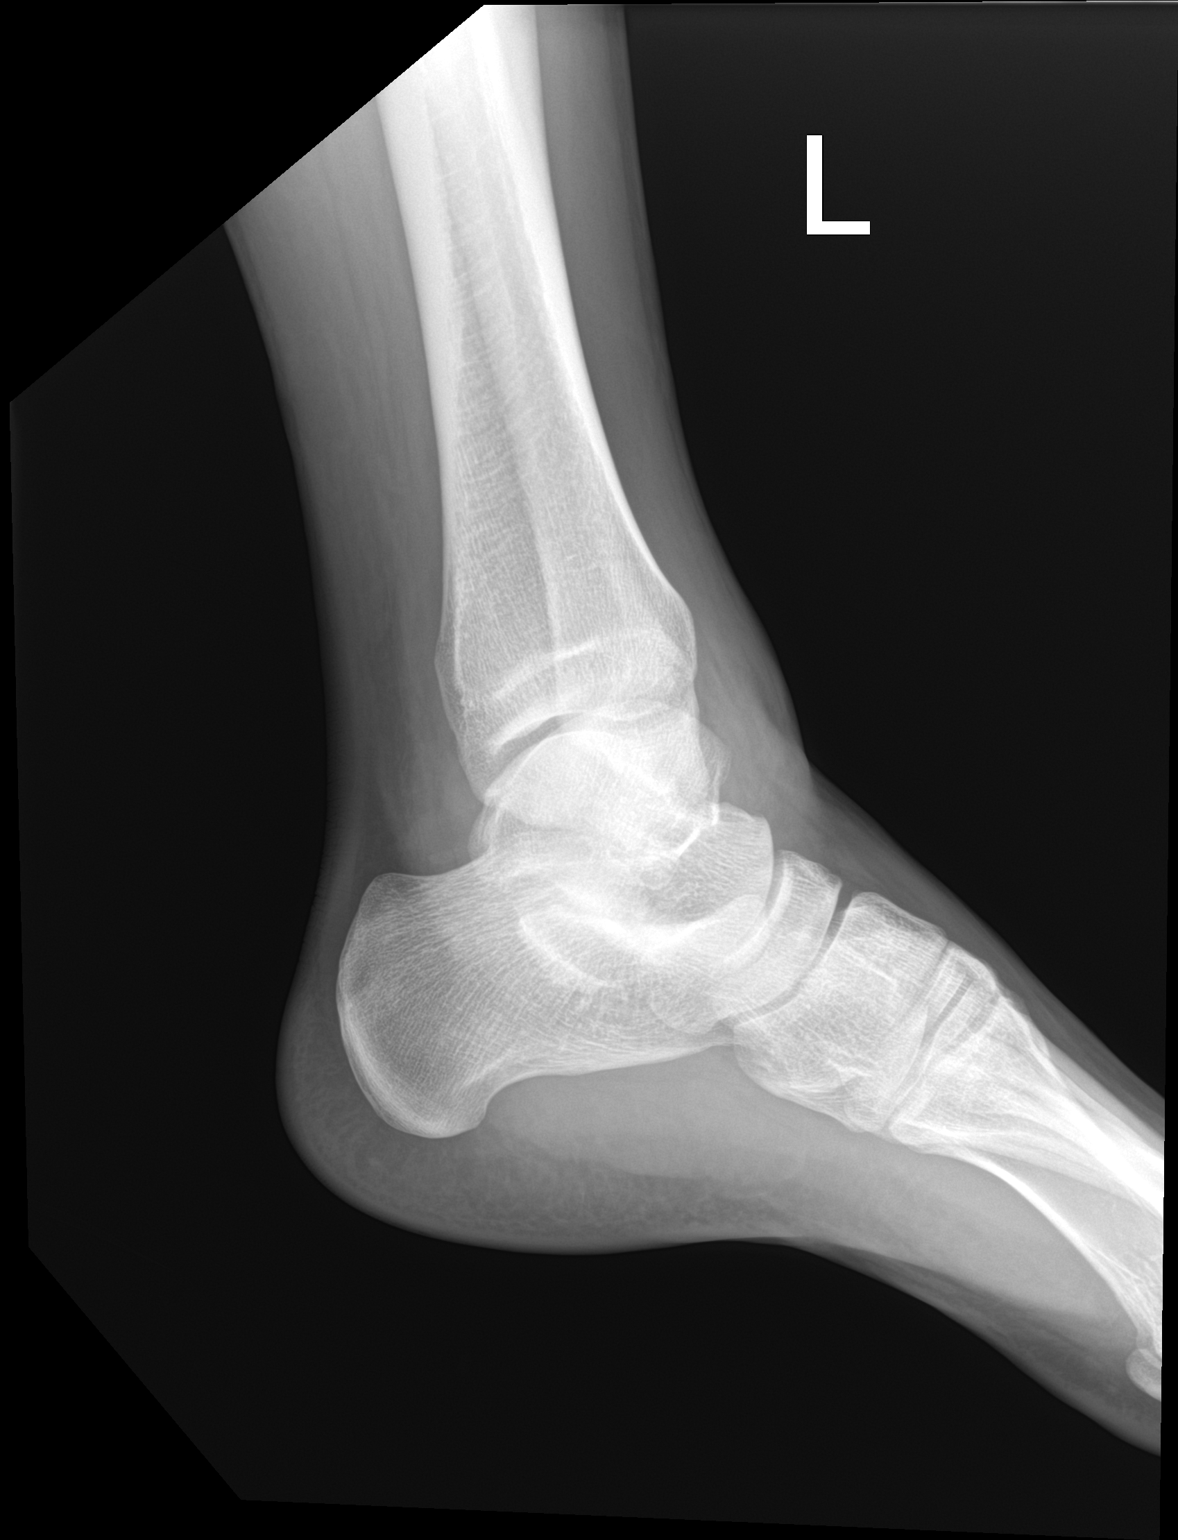

[3 of 3 positions shown; findings below may reference images not displayed]

FINDINGS: There is no evidence of fracture, dislocation, or joint effusion.
There is no evidence of arthropathy or other focal bone abnormality.
Diffuse soft tissue edema about the anterior and lateral ankle.
IMPRESSION: 1. No fracture or dislocation of the left ankle. Joint spaces are
well preserved.

2.  Diffuse soft tissue edema about the anterior and lateral ankle.

## 2023-11-30 ENCOUNTER — Emergency Department (HOSPITAL_COMMUNITY)
Admission: EM | Admit: 2023-11-30 | Discharge: 2023-11-30 | Disposition: A | Discharge status: Home / Self Care | Attending: Emergency Medicine | Admitting: Emergency Medicine

## 2023-11-30 ENCOUNTER — Encounter (HOSPITAL_COMMUNITY): Payer: Self-pay | Admitting: Emergency Medicine

## 2023-11-30 ENCOUNTER — Other Ambulatory Visit: Payer: Self-pay

## 2023-11-30 DIAGNOSIS — R220 Localized swelling, mass and lump, head: Secondary | ICD-10-CM | POA: Insufficient documentation

## 2023-11-30 LAB — CBC
HCT: 46.8 % (ref 39.0–52.0)
Hemoglobin: 15.3 g/dL (ref 13.0–17.0)
MCH: 28.3 pg (ref 26.0–34.0)
MCHC: 32.7 g/dL (ref 30.0–36.0)
MCV: 86.5 fL (ref 80.0–100.0)
Platelets: 240 K/uL (ref 150–400)
RBC: 5.41 MIL/uL (ref 4.22–5.81)
RDW: 13.4 % (ref 11.5–15.5)
WBC: 4.8 K/uL (ref 4.0–10.5)
nRBC: 0 % (ref 0.0–0.2)

## 2023-11-30 LAB — COMPREHENSIVE METABOLIC PANEL WITH GFR
ALT: 54 U/L — ABNORMAL HIGH (ref 0–44)
AST: 29 U/L (ref 15–41)
Albumin: 4.3 g/dL (ref 3.5–5.0)
Alkaline Phosphatase: 91 U/L (ref 38–126)
Anion gap: 9 (ref 5–15)
BUN: 17 mg/dL (ref 6–20)
CO2: 25 mmol/L (ref 22–32)
Calcium: 9.6 mg/dL (ref 8.9–10.3)
Chloride: 106 mmol/L (ref 98–111)
Creatinine, Ser: 1.11 mg/dL (ref 0.61–1.24)
GFR, Estimated: >60 mL/min (ref >60)
Glucose, Bld: 98 mg/dL (ref 70–99)
Potassium: 3.9 mmol/L (ref 3.5–5.1)
Sodium: 140 mmol/L (ref 135–145)
Total Bilirubin: 0.8 mg/dL (ref 0.0–1.2)
Total Protein: 7.9 g/dL (ref 6.5–8.1)

## 2023-11-30 MED ORDER — ACETAMINOPHEN 500 MG PO TABS
1000.0000 mg | ORAL_TABLET | Freq: Once | ORAL | Status: AC
  Administered 2023-11-30: 1000 mg via ORAL
  Filled 2023-11-30: qty 2

## 2023-11-30 MED ORDER — CEPHALEXIN 500 MG PO CAPS
500.0000 mg | ORAL_CAPSULE | Freq: Once | ORAL | Status: AC
  Administered 2023-11-30: 500 mg via ORAL
  Filled 2023-11-30: qty 1

## 2023-11-30 MED ORDER — CEPHALEXIN 500 MG PO CAPS: 500.0000 mg | ORAL_CAPSULE | Freq: Four times a day (QID) | ORAL | 0 refills | Status: AC

## 2023-11-30 NOTE — Discharge Instructions (Signed)
 Evaluation today revealed that you may have cellulitis about the right part of your nose.  This is an infection in the skin.  And starting on Keflex .  Please follow-up your PCP.  If your symptoms worsen in any way, please return to the ED for further evaluation.

## 2023-11-30 NOTE — ED Triage Notes (Signed)
 Pt presents stating he is getting over a cold and has been having some weird feeling to the right side of his body.  Pt attributed it to possibly the cold medication.  Pt reports he has a lesion to his right nare that he googled and is afraid is MRSA.  The odd feelings to the right side of his body have been going on all week but seemed to have increased today.  Neuro intact.  NIH 0 in triage.

## 2023-11-30 NOTE — ED Provider Notes (Addendum)
 [facility] AT [facility] Provider Note   CSN: [id number] Arrival date & time: 11/30/23  1531     Patient presents with: Facial Swelling  HPI Seth Jones is a 33 y.o. male presenting for swelling of his nose.  States he noticed it about 4 days ago.  It is located about the right nare seems to be worsening.  It swollen red and tender to touch.  States he did a [employer] and was concerned he might have MRSA.  Also reports that he has had a cold last couple weeks but is feeling better from that but reports a weird feeling primarily in the right side of his body.  He denies chest pain shortness of breath.  Denies fever at home.  Has not taken any medication for symptoms.   HPI     Prior to Admission medications   Medication Sig Start Date End Date Taking? Authorizing Provider  cephALEXin  (KEFLEX ) 500 MG capsule Take 1 capsule (500 mg total) by mouth 4 (four) times daily for 7 days. 11/30/23 12/07/23 Yes Evalena Fujii K, PA-C  cyclobenzaprine  (FLEXERIL ) 10 MG tablet Take 1 tablet (10 mg total) by mouth 2 (two) times daily as needed for muscle spasms. 08/23/21   Nivia Colon, PA-C  diclofenac  Sodium (VOLTAREN ) 1 % GEL Apply 2 g topically 4 (four) times daily. 08/23/21   Nivia Colon, PA-C  ibuprofen  (ADVIL ) 600 MG tablet Take 1 tablet (600 mg total) by mouth every 6 (six) hours as needed. 05/07/22   Vicky Charleston, PA-C    Allergies: Patient has no known allergies.    Review of Systems  Updated Vital Signs BP (!) 160/107 (BP Location: Right Arm)   Pulse 61   Temp 98.2 F (36.8 C) (Oral)   Resp 18   SpO2 95%   Physical Exam Vitals and nursing note reviewed.  HENT:     Head: Normocephalic and atraumatic.     Nose:      Comments: Both nares mildly erythematous nasal septum is intact no epistaxis noted.  See Pic below    Mouth/Throat:     Mouth: Mucous membranes are moist.  Eyes:     General:        Right eye: No discharge.        Left  eye: No discharge.     Conjunctiva/sclera: Conjunctivae normal.  Cardiovascular:     Rate and Rhythm: Normal rate and regular rhythm.     Pulses: Normal pulses.     Heart sounds: Normal heart sounds.  Pulmonary:     Effort: Pulmonary effort is normal.     Breath sounds: Normal breath sounds.  Abdominal:     General: Abdomen is flat.     Palpations: Abdomen is soft.  Skin:    General: Skin is warm and dry.  Neurological:     General: No focal deficit present.     Comments: GCS 15. Speech is goal oriented. No deficits appreciated to CN III-XII; symmetric eyebrow raise, no facial drooping, tongue midline. Patient has equal grip strength bilaterally with 5/5 strength against resistance in all major muscle groups bilaterally. Sensation to light touch intact. Patient moves extremities without ataxia. Normal finger-nose-finger. Patient ambulatory with steady gait.   Psychiatric:        Mood and Affect: Mood normal.        (all labs ordered are listed, but only abnormal results are displayed) Labs Reviewed  COMPREHENSIVE METABOLIC PANEL WITH GFR - Abnormal; Notable  for the following components:      Result Value   ALT 54 (*)    All other components within normal limits  CBC    EKG: None  Radiology: No results found.   Procedures   Medications Ordered in the ED  cephALEXin  (KEFLEX ) capsule 500 mg (has no administration in time range)  acetaminophen  (TYLENOL ) tablet 1,000 mg (has no administration in time range)                                    Medical Decision Making Amount and/or Complexity of Data Reviewed Labs: ordered.  Risk OTC drugs. Prescription drug management.   33 year old well-appearing male presenting for nasal swelling.  Exam findings were somewhat concerning for cellulitis forming about the right nare.  Otherwise he looks well-appearing, no acute distress, and hemodynamically stable.  Labs unremarkable.  Started him on Keflex  and advised supportive  treatment at home.  Advised on follow-up PCP.  Discussed return precautions.  Discharged good condition.     Final diagnoses:  Nasal swelling    ED Discharge Orders          Ordered    cephALEXin  (KEFLEX ) 500 MG capsule  4 times daily        11/30/23 1818               Preston Garabedian K, PA-C 11/30/23 1759    Ygnacio Fecteau K, PA-C 11/30/23 1818    Patt Alm Macho, MD 11/30/23 [phone]
